# Patient Record
Sex: Male | Born: 1980 | Hispanic: No | Marital: Married | Smoking: Never smoker
Health system: Southern US, Community
[De-identification: ages and names within clinical notes are randomized; demographics above are authoritative.]

---
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
## 2018-03-27 ENCOUNTER — Encounter (HOSPITAL_COMMUNITY): Payer: Self-pay | Admitting: Emergency Medicine

## 2018-03-27 ENCOUNTER — Emergency Department (HOSPITAL_COMMUNITY)
Admission: EM | Admit: 2018-03-27 | Discharge: 2018-03-27 | Disposition: A | Discharge status: Home / Self Care | Payer: Self-pay | Attending: Emergency Medicine | Admitting: Emergency Medicine

## 2018-03-27 DIAGNOSIS — R51 Headache: Secondary | ICD-10-CM | POA: Insufficient documentation

## 2018-03-27 DIAGNOSIS — R519 Headache, unspecified: Secondary | ICD-10-CM

## 2018-03-27 NOTE — ED Notes (Signed)
Declined W/C at D/C and was escorted to lobby by RN. 
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
## 2018-03-27 NOTE — Discharge Instructions (Signed)
You may alternate taking Tylenol and Ibuprofen as needed for pain control. You may take 400-600 mg of ibuprofen every 6 hours and [phone] mg of Tylenol every 6 hours. Do not exceed 4000 mg of Tylenol daily as this can lead to liver damage. Also, make sure to take Ibuprofen with meals as it can cause an upset stomach. Do not take other NSAIDs while taking Ibuprofen such as (Aleve, Naprosyn, Aspirin, Celebrex, etc) and do not take more than the prescribed dose as this can lead to ulcers and bleeding in your GI tract. You may use warm and cold compresses to help with your symptoms.   Please follow up with your primary doctor within the next 7-10 days for re-evaluation and further treatment of your symptoms.   Please return to the ER sooner if you have any new or worsening symptoms including severe headaches, lightheadedness/dizziness, vision changes, numbness, weakness, vomiting, or seizures.

## 2018-04-02 ENCOUNTER — Emergency Department (HOSPITAL_COMMUNITY)
Admission: EM | Admit: 2018-04-02 | Discharge: 2018-04-02 | Disposition: A | Discharge status: Home / Self Care | Payer: No Typology Code available for payment source | Attending: Emergency Medicine | Admitting: Emergency Medicine

## 2018-04-02 ENCOUNTER — Emergency Department (HOSPITAL_COMMUNITY): Payer: No Typology Code available for payment source

## 2018-04-02 ENCOUNTER — Encounter (HOSPITAL_COMMUNITY): Payer: Self-pay | Admitting: *Deleted

## 2018-04-02 DIAGNOSIS — S0990XA Unspecified injury of head, initial encounter: Secondary | ICD-10-CM | POA: Insufficient documentation

## 2018-04-02 DIAGNOSIS — Y999 Unspecified external cause status: Secondary | ICD-10-CM | POA: Insufficient documentation

## 2018-04-02 DIAGNOSIS — Y92410 Unspecified street and highway as the place of occurrence of the external cause: Secondary | ICD-10-CM | POA: Insufficient documentation

## 2018-04-02 DIAGNOSIS — R42 Dizziness and giddiness: Secondary | ICD-10-CM | POA: Diagnosis present

## 2018-04-02 DIAGNOSIS — Y9389 Activity, other specified: Secondary | ICD-10-CM | POA: Insufficient documentation

## 2018-04-02 DIAGNOSIS — S060X0A Concussion without loss of consciousness, initial encounter: Secondary | ICD-10-CM | POA: Diagnosis not present

## 2018-04-02 MED ORDER — MECLIZINE HCL 25 MG PO TABS
25.0000 mg | ORAL_TABLET | Freq: Once | ORAL | Status: AC
  Administered 2018-04-02: 25 mg via ORAL
  Filled 2018-04-02: qty 1

## 2018-04-02 MED ORDER — MECLIZINE HCL 25 MG PO TABS: 25.0000 mg | ORAL_TABLET | Freq: Three times a day (TID) | ORAL | 0 refills | Status: AC | PRN
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
## 2018-04-02 NOTE — ED Notes (Signed)
To ct

## 2018-04-02 NOTE — ED Notes (Signed)
Pt returned from ct
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
## 2018-04-02 NOTE — ED Notes (Signed)
Pupils equal and react to light wnl
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
## 2019-09-10 ENCOUNTER — Ambulatory Visit: Payer: No Typology Code available for payment source | Attending: Internal Medicine
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
